# Patient Record
Sex: Female | Born: 1990 | Race: White | Hispanic: No | Marital: Single | Smoking: Never smoker
Health system: Southern US, Community
[De-identification: ages and names within clinical notes are randomized; demographics above are authoritative.]

## PROBLEM LIST (undated history)

## (undated) DIAGNOSIS — R87629 Unspecified abnormal cytological findings in specimens from vagina: Secondary | ICD-10-CM

## (undated) DIAGNOSIS — E559 Vitamin D deficiency, unspecified: Secondary | ICD-10-CM

## (undated) HISTORY — DX: Vitamin D deficiency, unspecified: E55.9

## (undated) HISTORY — DX: Unspecified abnormal cytological findings in specimens from vagina: R87.629

## (undated) HISTORY — PX: NASAL SINUS SURGERY: SHX719

---

## 2021-02-03 ENCOUNTER — Emergency Department: Admit: 2021-02-03 | Payer: BLUE CROSS/BLUE SHIELD

## 2021-02-03 ENCOUNTER — Inpatient Hospital Stay
Admit: 2021-02-03 | Discharge: 2021-02-03 | Disposition: A | Discharge status: Home / Self Care | Payer: BLUE CROSS/BLUE SHIELD

## 2021-02-03 DIAGNOSIS — O209 Hemorrhage in early pregnancy, unspecified: Secondary | ICD-10-CM

## 2021-02-03 LAB — URINALYSIS WITH MICROSCOPIC
Bilirubin Urine: NEGATIVE
Blood, Urine: NEGATIVE
Glucose, Ur: NEGATIVE mg/dL
Ketones, Urine: 15 mg/dL — AB
Leukocyte Esterase, Urine: NEGATIVE
Nitrite, Urine: NEGATIVE
Protein, UA: NEGATIVE mg/dL
Specific Gravity, UA: 1.01 (ref 1.005–1.030)
Urobilinogen, Urine: 0.2 E.U./dL (ref <2.0)
pH, UA: 6 (ref 5.0–9.0)

## 2021-02-03 LAB — CBC WITH AUTO DIFFERENTIAL
Basophils %: 0.4 % (ref 0.0–2.0)
Basophils Absolute: 0.04 E9/L (ref 0.00–0.20)
Eosinophils %: 0.8 % (ref 0.0–6.0)
Eosinophils Absolute: 0.07 E9/L (ref 0.05–0.50)
Hematocrit: 38 % (ref 34.0–48.0)
Hemoglobin: 13 g/dL (ref 11.5–15.5)
Immature Granulocytes #: 0.03 E9/L
Immature Granulocytes %: 0.3 % (ref 0.0–5.0)
Lymphocytes %: 18.9 % — ABNORMAL LOW (ref 20.0–42.0)
Lymphocytes Absolute: 1.73 E9/L (ref 1.50–4.00)
MCH: 29.5 pg (ref 26.0–35.0)
MCHC: 34.2 % (ref 32.0–34.5)
MCV: 86.4 fL (ref 80.0–99.9)
MPV: 9.2 fL (ref 7.0–12.0)
Monocytes %: 11.2 % (ref 2.0–12.0)
Monocytes Absolute: 1.03 E9/L — ABNORMAL HIGH (ref 0.10–0.95)
Neutrophils %: 68.4 % (ref 43.0–80.0)
Neutrophils Absolute: 6.26 E9/L (ref 1.80–7.30)
Platelets: 313 E9/L (ref 130–450)
RBC: 4.4 E12/L (ref 3.50–5.50)
RDW: 12.5 fL (ref 11.5–15.0)
WBC: 9.2 E9/L (ref 4.5–11.5)

## 2021-02-03 LAB — COMPREHENSIVE METABOLIC PANEL W/ REFLEX TO MG FOR LOW K
ALT: 32 U/L (ref 0–32)
AST: 17 U/L (ref 0–31)
Albumin: 3.7 g/dL (ref 3.5–5.2)
Alkaline Phosphatase: 60 U/L (ref 35–104)
Anion Gap: 13 mmol/L (ref 7–16)
BUN: 3 mg/dL — ABNORMAL LOW (ref 6–20)
CO2: 22 mmol/L (ref 22–29)
Calcium: 9.3 mg/dL (ref 8.6–10.2)
Chloride: 101 mmol/L (ref 98–107)
Creatinine: 0.4 mg/dL — ABNORMAL LOW (ref 0.5–1.0)
GFR African American: >60
GFR Non-African American: >60 mL/min/{1.73_m2} (ref >=60)
Glucose: 95 mg/dL (ref 74–99)
Potassium reflex Magnesium: 3.4 mmol/L — ABNORMAL LOW (ref 3.5–5.0)
Sodium: 136 mmol/L (ref 132–146)
Total Bilirubin: 0.3 mg/dL (ref 0.0–1.2)
Total Protein: 7.4 g/dL (ref 6.4–8.3)

## 2021-02-03 LAB — MAGNESIUM: Magnesium: 2 mg/dL (ref 1.6–2.6)

## 2021-02-03 MED ORDER — SODIUM CHLORIDE 0.9 % IV BOLUS
0.9 % | Freq: Once | INTRAVENOUS | Status: AC
Start: 2021-02-03 — End: 2021-02-03
  Administered 2021-02-03: 22:00:00 500 mL via INTRAVENOUS

## 2021-02-03 NOTE — ED Provider Notes (Signed)
[facility]       [facility]  Department of Emergency Medicine   ED  Encounter Note  Admit Date/RoomTime: 02/03/2021  4:27 PM  ED Room: 13/13  NAME: Lauren Mejia  DOB: 06-Jun-1991  MRN: [id number]     Chief Complaint:  Vaginal Bleeding (Patient states that she had some bright red bleeding while she was in [place] last week was told to come to er to be checked when she returned to states.  Now no bleeding )    HISTORY OF PRESENT ILLNESS        Lauren Mejia is a 30 y.o. female who presents to the ED requesting an ultrasound for vaginal bleeding.  Patient has an OB history of gravida 1, para 0.  Currently [redacted] weeks pregnant.  She is from the state of [place] and is currently in town visiting family.  Patient states she has had an OB appointment within normal ultrasound at 10 weeks, no complications.  Patient states last week she was in [place].  Over 1 week ago she had 1 episode of bright red bleeding where she filled about 75% of the pad.  Some associated pelvic cramping.  Then for the next 2 days she just had some brown vaginal discharge.  She only had the 3 days of bleeding and then has not had any bleeding for the past 5 to 6 days.  No further abdominal cramping.  She denies any fevers or chills.  Denies any nausea or vomiting.  Denies any urinary symptoms.  Patient does not remember being sexually active prior to the vaginal bleeding.  Patient flew back into the [facility] airport to visit family in this area when she decided to come to this ER for an ultrasound.  Patient denies any pain or vaginal bleeding at this time.    ROS   Pertinent positives and negatives are stated within HPI, all other systems reviewed and are negative.    Past Medical History:  has no past medical history on file.    Surgical History:  has no past surgical history on file.    Social History:      Family History: family history is not on file.     Allergies: Patient has no known allergies.    PHYSICAL EXAM    Oxygen Saturation Interpretation: Normal on room air analysis.        ED Triage Vitals   BP Temp Temp Source Heart Rate Resp SpO2 Height Weight   02/03/21 1555 02/03/21 1555 02/03/21 1555 02/03/21 1555 02/03/21 1555 02/03/21 1555 02/03/21 1627 02/03/21 1627   126/86 98.6 ??F (37 ??C) Temporal (!) 110 18 99 % 5\' 1"  (1.549 m) 160 lb (72.6 kg)       General:  NAD.  Alert and Oriented.  Well-appearing.  Skin:  Warm, dry.  No rashes.  Head:  Normocephalic.  Atraumatic.  Eyes:  EOMI.  Conjunctiva normal.  ENT:  Oral mucosa moist.  Airway patent.  Neck:  Supple.  Normal ROM.    Respiratory:  No respiratory distress.  No labored breathing.  Lungs clear without rales, rhonchi or wheezing.  Cardiovascular:  Regular rate.  No Murmur.  No peripheral edema.  Extremities warm and good color.  Chest:  Abdomen:  Soft, nondistended.  Normal bowel sounds.  Nontender to palpation all 4 quadrants.  Negative rebound, negative guarding.  Rectal:  Gu:  Bladder nontender and non distended.  No CVA tenderness.  Pelvic:  Extremities:  Normal ROM.  Nontender to palpation.  Atraumatic.    Back:  Normal ROM.  Nontender to palpation.  Neuro:  Alert and Oriented to person, place, time and situation.  Normal LOC.  Moves all extremities.  Speech fluent.  Psych:  Calm and Cooperative.  Normal thought process.  Normal judgement.        Lab / Imaging Results   (All laboratory and radiology results have been personally reviewed by myself)  Labs:  Results for orders placed or performed during the hospital encounter of 02/03/21   Urinalysis with Microscopic   Result Value Ref Range    Color, UA Yellow Straw/Yellow    Clarity, UA Clear Clear    Glucose, Ur Negative Negative mg/dL    Bilirubin Urine Negative Negative    Ketones, Urine 15 (A) Negative mg/dL    Specific Gravity, UA 1.010 1.005 - 1.030    Blood, Urine Negative Negative    pH, UA 6.0 5.0 - 9.0    Protein, UA Negative Negative mg/dL    Urobilinogen, Urine 0.2 <2.0 E.U./dL    Nitrite, Urine  Negative Negative    Leukocyte Esterase, Urine Negative Negative    WBC, UA 1-3 0 - 5 /HPF    RBC, UA NONE 0 - 2 /HPF    Epithelial Cells, UA MODERATE /HPF    Bacteria, UA RARE (A) None Seen /HPF   CBC with Auto Differential   Result Value Ref Range    WBC 9.2 4.5 - 11.5 E9/L    RBC 4.40 3.50 - 5.50 E12/L    Hemoglobin 13.0 11.5 - 15.5 g/dL    Hematocrit 03.4 74.2 - 48.0 %    MCV 86.4 80.0 - 99.9 fL    MCH 29.5 26.0 - 35.0 pg    MCHC 34.2 32.0 - 34.5 %    RDW 12.5 11.5 - 15.0 fL    Platelets 313 130 - 450 E9/L    MPV 9.2 7.0 - 12.0 fL    Neutrophils % 68.4 43.0 - 80.0 %    Immature Granulocytes % 0.3 0.0 - 5.0 %    Lymphocytes % 18.9 (L) 20.0 - 42.0 %    Monocytes % 11.2 2.0 - 12.0 %    Eosinophils % 0.8 0.0 - 6.0 %    Basophils % 0.4 0.0 - 2.0 %    Neutrophils Absolute 6.26 1.80 - 7.30 E9/L    Immature Granulocytes # 0.03 E9/L    Lymphocytes Absolute 1.73 1.50 - 4.00 E9/L    Monocytes Absolute 1.03 (H) 0.10 - 0.95 E9/L    Eosinophils Absolute 0.07 0.05 - 0.50 E9/L    Basophils Absolute 0.04 0.00 - 0.20 E9/L   Comprehensive Metabolic Panel w/ Reflex to MG   Result Value Ref Range    Sodium 136 132 - 146 mmol/L    Potassium reflex Magnesium 3.4 (L) 3.5 - 5.0 mmol/L    Chloride 101 98 - 107 mmol/L    CO2 22 22 - 29 mmol/L    Anion Gap 13 7 - 16 mmol/L    Glucose 95 74 - 99 mg/dL    BUN 3 (L) 6 - 20 mg/dL    CREATININE 0.4 (L) 0.5 - 1.0 mg/dL    GFR Non-African American >60 >=60 mL/min/1.73    GFR African American >60     Calcium 9.3 8.6 - 10.2 mg/dL    Total Protein 7.4 6.4 - 8.3 g/dL    Albumin 3.7 3.5 - 5.2 g/dL    Total  Bilirubin 0.3 0.0 - 1.2 mg/dL    Alkaline Phosphatase 60 35 - 104 U/L    ALT 32 0 - 32 U/L    AST 17 0 - 31 U/L     Imaging:  All Radiology results interpreted by Radiologist unless otherwise noted.  US OB 1 OR MORE FETUS LIMITED   Final Result   Single live intrauterine pregnancy with gestational age of [redacted] weeks and 5   days by current sonographic biometry which is concordant to the clinical age    provided of 14 weeks and 4 days.  The estimated due date based on current   ultrasound is July 30, 2021.  Fetal heart rate was 160 beats per minute.      Currently breech presentation with no evidence of previa.  Visually normal   amniotic fluid.  Estimated fetal weight was 99 g.      RECOMMENDATIONS:   Recommend scheduling the patient for a complete anatomic survey at   approximately [redacted] weeks gestation.             ED Course / Medical Decision Making     Medications   0.9 % sodium chloride bolus (500 mLs IntraVENous New Bag 02/03/21 1803)        Re-examination:  02/03/21       Time:   Patient???s condition .    Consult(s):   None    Procedure(s):   none    MDM:   All results discussed with patient and family at bedside.  She does feel some much better after knowing that the baby is fine.  I also went over her lab work with her.  Did hydrate her with 500 of normal saline, more as a comfort measure with all the traveling she has done.  No further complaints no further questions or concerns.    Plan of Care/Counseling:  Physician Assistant on duty reviewed today's visit with the patient in addition to providing specific details for the plan of care and counseling regarding the diagnosis and prognosis.  Questions are answered at this time and are agreeable with the plan.    ASSESSMENT     1. Vaginal bleeding in pregnancy    2. Vaginal bleeding      PLAN   Discharged home.  Patient condition is good    New Medications     New Prescriptions    No medications on file     Electronically signed by Soyla Murphy, PA   DD: 02/03/21  **This report was transcribed using voice recognition software. Every effort was made to ensure accuracy; however, inadvertent computerized transcription errors may be present.  END OF ED PROVIDER NOTE       Soyla Murphy, PA  02/03/21 1835       Soyla Murphy, [place]  02/03/21 [phone]

## 2022-05-07 IMAGING — US US ABDOMEN LIMITED
1 series · 15 of 25 positions shown · non-contrast
Comparison: None

CLINICAL DATA: Elevated liver enzymes in a 33-year-old female,
suspicion for "HELLP syndrome".

EXAM:
ULTRASOUND ABDOMEN LIMITED RIGHT UPPER QUADRANT

[Series 1: us abdomen limited · 15 of 38 slices shown]
[im 1/38]
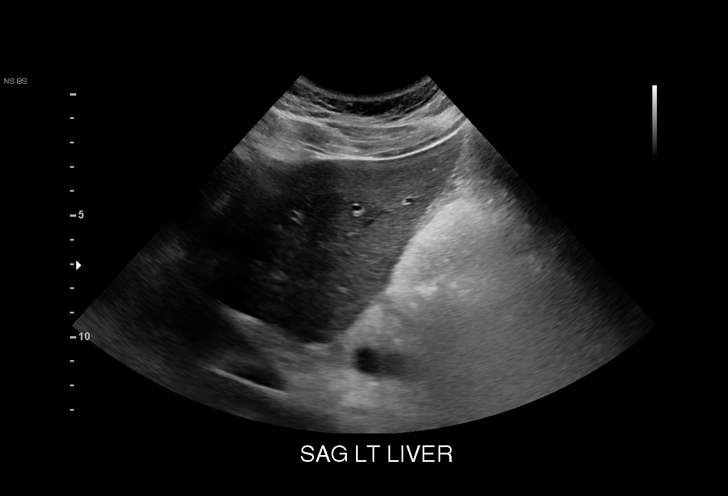
[im 4/38]
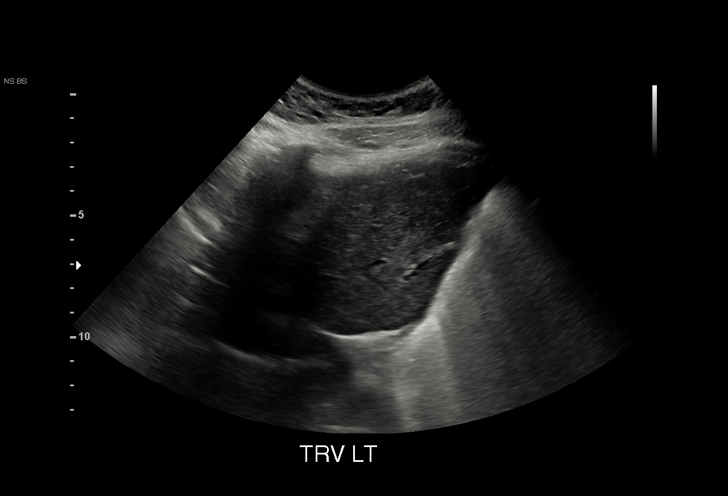
[im 7/38]
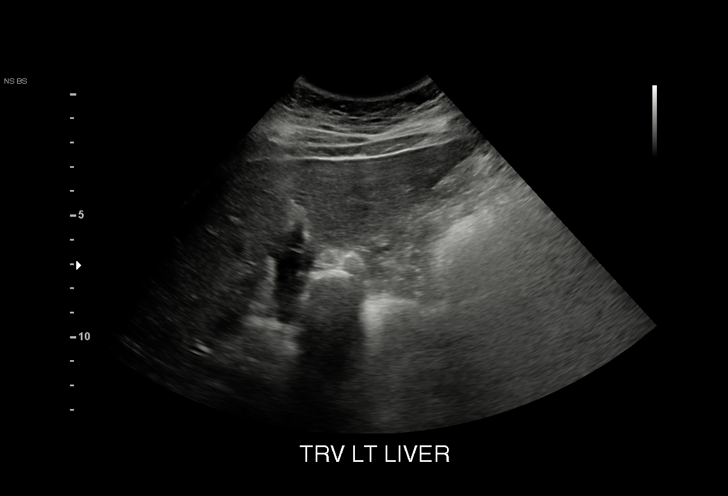
[im 8/38]
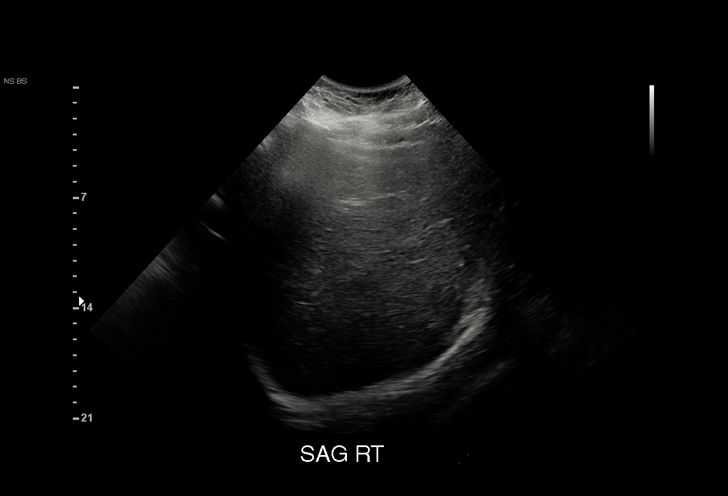
[im 11/38]
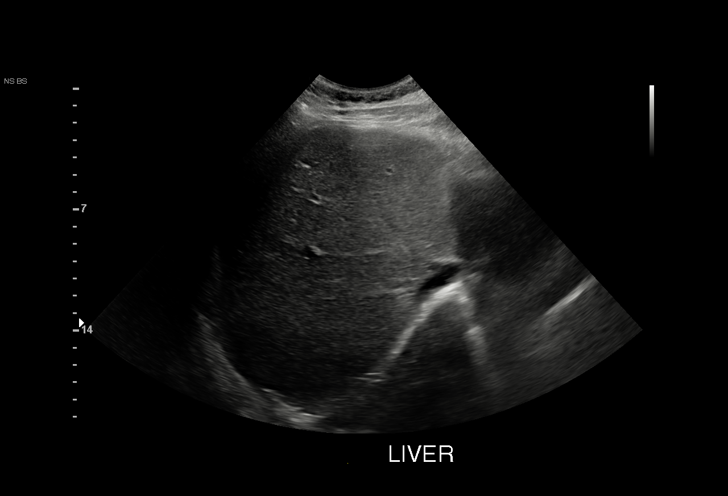
[im 14/38]
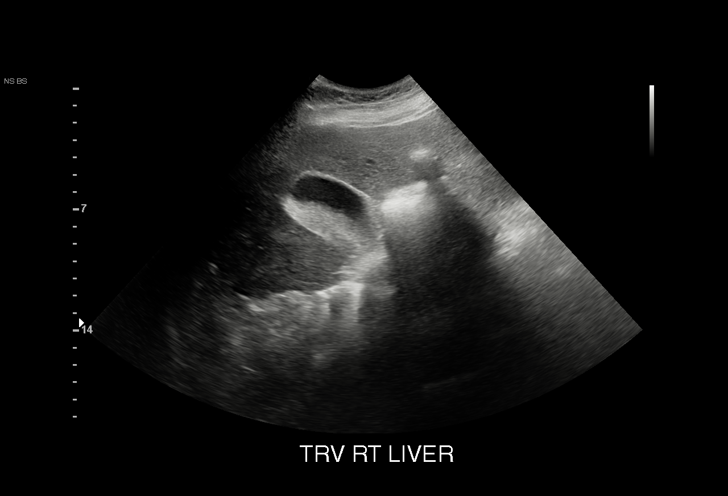
[im 16/38]
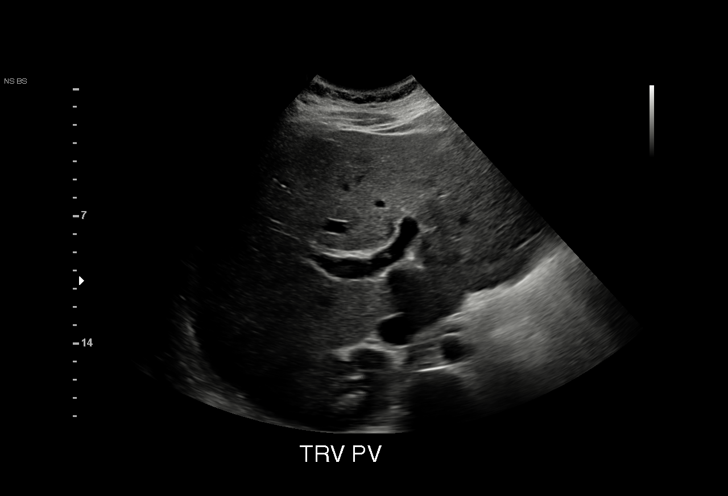
[im 19/38]
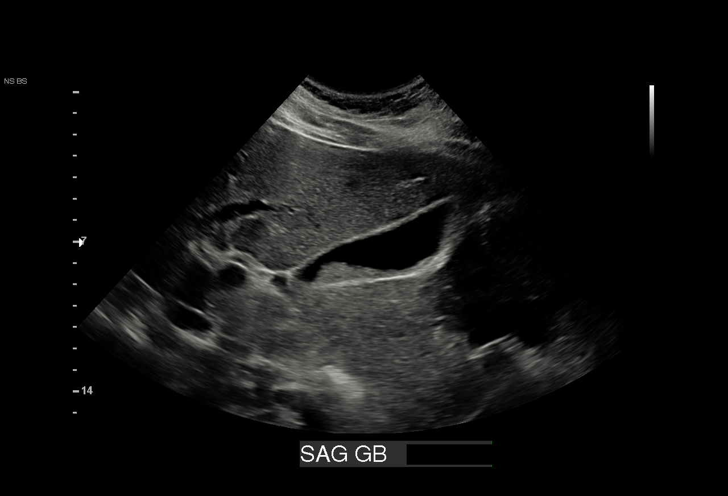
[im 22/38]
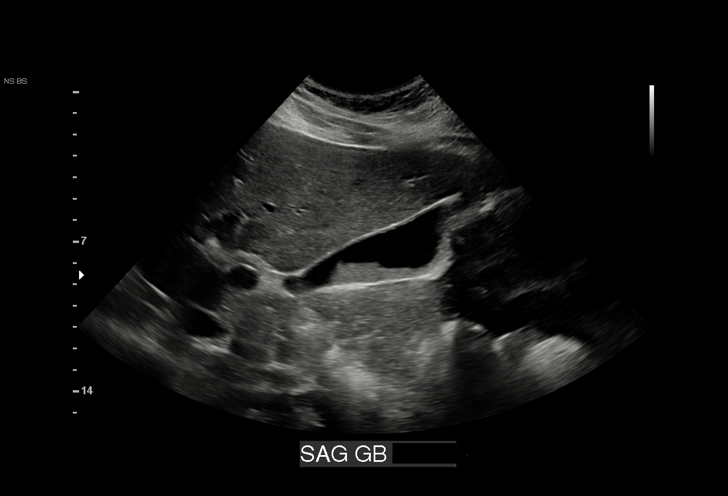
[im 24/38]
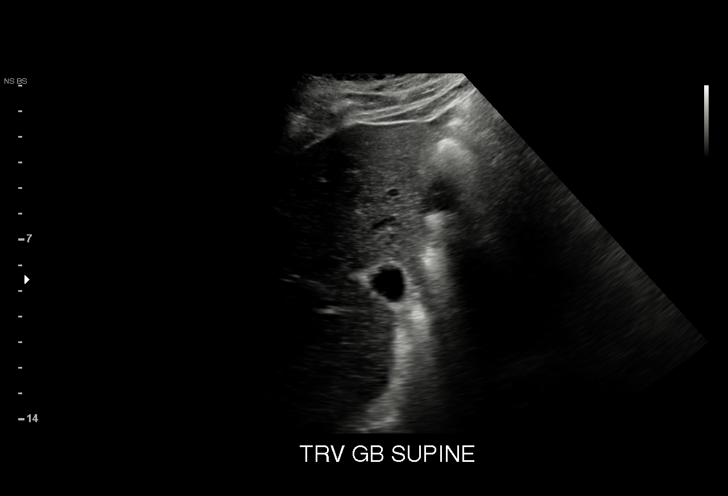
[im 27/38]
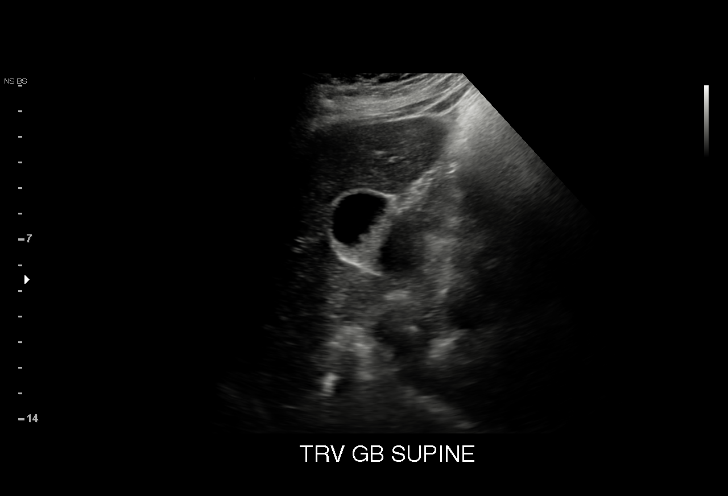
[im 30/38]
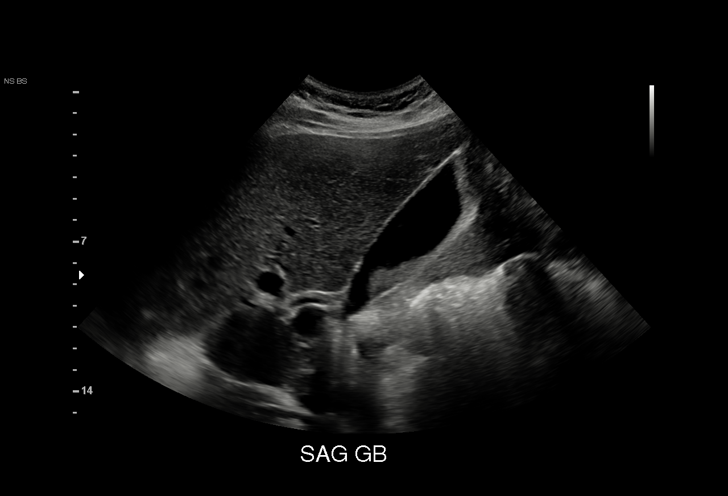
[im 31/38]
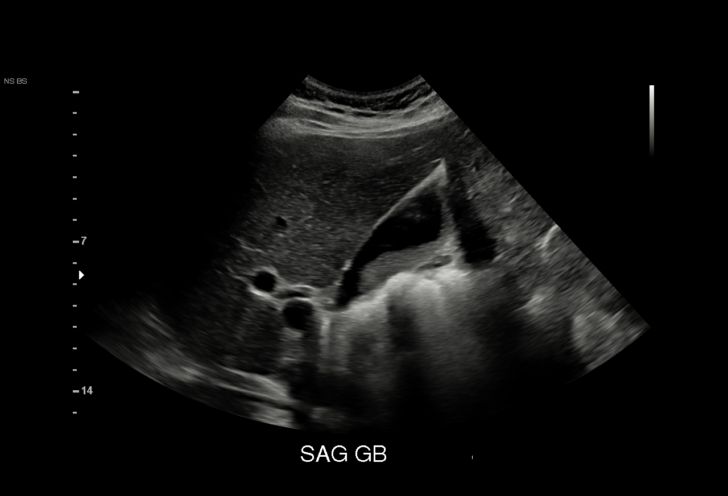
[im 34/38]
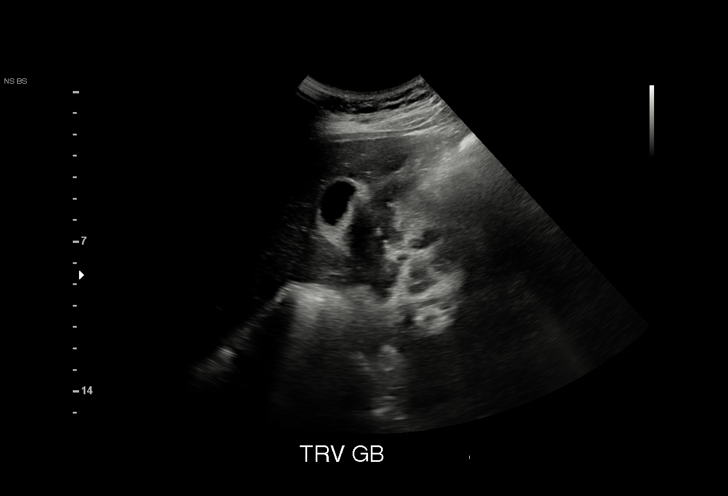
[im 38/38]
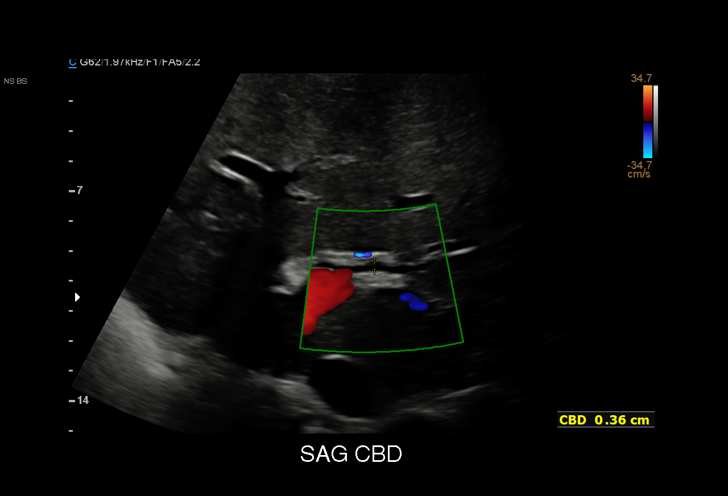

[15 of 25 positions shown; findings below may reference images not displayed]

FINDINGS: Gallbladder:

Sludge in the dependent gallbladder. No wall thickening or
pericholecystic fluid. No reported tenderness over the gallbladder.

Common bile duct:

Diameter: 3.6 mm

Liver:

Mildly increased hepatic echogenicity. No focal abnormality in the
liver. Portal vein is patent on color Doppler imaging with normal
direction of blood flow towards the liver.

Other: None.
IMPRESSION: No acute biliary findings by ultrasound.

Sludge in the gallbladder.

Question mild hepatic steatosis.

## 2022-10-07 DIAGNOSIS — N925 Other specified irregular menstruation: Secondary | ICD-10-CM

## 2022-10-12 DIAGNOSIS — N925 Other specified irregular menstruation: Secondary | ICD-10-CM | POA: Diagnosis present

## 2022-10-18 DIAGNOSIS — Z363 Encounter for antenatal screening for malformations: Secondary | ICD-10-CM

## 2022-11-21 DIAGNOSIS — O09899 Supervision of other high risk pregnancies, unspecified trimester: Secondary | ICD-10-CM | POA: Insufficient documentation

## 2022-11-21 DIAGNOSIS — Z8759 Personal history of other complications of pregnancy, childbirth and the puerperium: Secondary | ICD-10-CM | POA: Insufficient documentation

## 2022-11-21 DIAGNOSIS — O99210 Obesity complicating pregnancy, unspecified trimester: Secondary | ICD-10-CM | POA: Insufficient documentation

## 2022-11-24 DIAGNOSIS — Z3A19 19 weeks gestation of pregnancy: Secondary | ICD-10-CM

## 2022-11-24 DIAGNOSIS — Z363 Encounter for antenatal screening for malformations: Secondary | ICD-10-CM

## 2022-11-24 DIAGNOSIS — O09292 Supervision of pregnancy with other poor reproductive or obstetric history, second trimester: Secondary | ICD-10-CM

## 2022-11-24 DIAGNOSIS — Z8759 Personal history of other complications of pregnancy, childbirth and the puerperium: Secondary | ICD-10-CM

## 2022-11-24 DIAGNOSIS — O09899 Supervision of other high risk pregnancies, unspecified trimester: Secondary | ICD-10-CM

## 2022-11-24 DIAGNOSIS — O09299 Supervision of pregnancy with other poor reproductive or obstetric history, unspecified trimester: Secondary | ICD-10-CM

## 2022-11-24 DIAGNOSIS — O99210 Obesity complicating pregnancy, unspecified trimester: Secondary | ICD-10-CM | POA: Diagnosis present

## 2022-11-24 NOTE — Progress Notes (Signed)
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  Her child is doing well today.    She is currently treated with a daily baby aspirin for preeclampsia prophylaxis.  Her blood pressure today was 135/78.  She denies any history of hypertension and denies any problems in her current pregnancy.  She had a cell free DNA test earlier in her pregnancy which indicated a low risk for trisomy 79, 72, and 13. A female fetus is predicted.   She was informed that the fetal growth and amniotic fluid level were appropriate for her gestational age.   There were no obvious fetal anomalies noted on today's ultrasound exam.  The patient was informed that anomalies may be missed due to technical limitations. If the fetus is in a suboptimal position or maternal habitus is increased, visualization of the fetus in the maternal uterus may be impaired.  The following were discussed during today's consultation:  History of HELLP syndrome requiring delivery at 34+ weeks in her last pregnancy  Due to her significant history of HELLP syndrome, she was advised to take 2 tablets of baby aspirin daily (81 mg each) for preeclampsia prophylaxis.  The risk of recurrence of severe preeclampsia may be as high as 20%.  Hopefully, her risk will be decreased with the baby aspirin.  Due to her history of preeclampsia, we will continue to follow her with growth ultrasounds throughout her pregnancy.  A follow-up growth scan was scheduled in 5 weeks.    The patient stated that all of her questions were answered today.  A total of 30 minutes was spent counseling and coordinating the care for this patient.  Greater than 50% of the time was spent in direct face-to-face contact.

## 2022-12-13 DIAGNOSIS — N939 Abnormal uterine and vaginal bleeding, unspecified: Secondary | ICD-10-CM

## 2022-12-13 DIAGNOSIS — O4692 Antepartum hemorrhage, unspecified, second trimester: Secondary | ICD-10-CM
